# Patient Record
Sex: Male | Born: 1965 | Race: Black or African American | Hispanic: No | Marital: Married
Health system: Southern US, Community
[De-identification: ages and names within clinical notes are randomized; demographics above are authoritative.]

## PROBLEM LIST (undated history)

## (undated) DIAGNOSIS — I10 Essential (primary) hypertension: Secondary | ICD-10-CM

## (undated) DIAGNOSIS — I639 Cerebral infarction, unspecified: Secondary | ICD-10-CM

## (undated) DIAGNOSIS — I509 Heart failure, unspecified: Secondary | ICD-10-CM

---

## 2015-01-11 ENCOUNTER — Inpatient Hospital Stay: Payer: Self-pay | Admitting: Internal Medicine

## 2015-01-12 ENCOUNTER — Other Ambulatory Visit: Payer: Self-pay | Admitting: Physician Assistant

## 2015-01-12 DIAGNOSIS — R7989 Other specified abnormal findings of blood chemistry: Secondary | ICD-10-CM

## 2015-01-12 DIAGNOSIS — I313 Pericardial effusion (noninflammatory): Secondary | ICD-10-CM

## 2015-01-12 DIAGNOSIS — I4891 Unspecified atrial fibrillation: Secondary | ICD-10-CM

## 2015-01-12 DIAGNOSIS — N179 Acute kidney failure, unspecified: Secondary | ICD-10-CM

## 2015-01-12 DIAGNOSIS — I10 Essential (primary) hypertension: Secondary | ICD-10-CM

## 2015-03-19 NOTE — Discharge Summary (Signed)
PATIENT NAME:  Dennis Hoover, Dennis Hoover MR#:  [id number] DATE OF BIRTH:  [birth year]  DATE OF ADMISSION:  01/11/2015 DATE OF DISCHARGE:  01/14/2015  ADMITTING COMPLAINT: Shortness of breath.   DISCHARGE DIAGNOSES:  1. Atrial fibrillation with rapid ventricular rate, now controlled.  2. Elevated troponin due to demand ischemia.  3. Chronic systolic congestive heart failure with ejection fraction of less than 20%.  4. Bilateral pneumonia.  5. Acute renal insufficiency.  6. Hypertension  7. Morbid obesity.  8. Recent tobacco abuse.   CONSULTATIONS:  1. Julien Nordmannimothy Gollan, MD, cardiology.  2. Dr. Mady GemmaMohammed Arida, MD, cardiology.   PROCEDURES: A 2D echocardiogram 01/12/2015 showing left ventricular ejection fraction less than 20%. Severely decreased global left ventricular systolic function, akinesis of the anterior and anterior septal walls, moderate concentric left ventricular hypertrophy, mild to moderate increased left ventricular internal cavity size, severely dilated left atrium, mildly dilated right atrium, small pericardial effusion, moderate mitral valve regurgitation, mild aortic valve sclerosis without stenosis and mild tricuspid regurgitation.   Chest x-ray 01/11/2015 shows diffuse reticular nodular opacities, suggesting bronchopneumonia, bronchitis. Also possible hypersensitivity, pneumonitis, interstitial pneumonias versus sarcoid disease. Repeat imaging once the patient has been treated for acute process is recommended.   HISTORY OF PRESENT ILLNESS: This 49 year old African American man with past medical history of atrial fibrillation, hypertension, congestive heart failure and morbid obesity stopped taking his medications greater than 30 days ago. Over the past day of admission, he felt more short of breath and developed cough with yellow productive sputum. No fevers. On presentation to the Emergency Room, he is in atrial fibrillation with rapid ventricular rate. Has bilateral pneumonia, is wheezing  and elevated troponin.   HOSPITAL COURSE BY PROBLEM:  1. Atrial fibrillation with rapid ventricular rate: The patient was followed by cardiology throughout the hospitalization. He was treated with Lopressor 100 mg twice a day and Cardizem 120 mg, which was then increased to 180 mg. A 2D echocardiogram showed decreased ejection fraction. He does have a CHADS score of 1 and abnormal LV function, so was started on anticoagulation with Eliquis. He had been on Xarelto in the past, but had used up his coupons. The patient does not have any funding for medication and was given a coupon for Eliquis.  2. Bilateral pneumonia: The patient was treated with Levaquin and prednisone. At the time of discharge, he is breathing comfortably with good oxygen saturations on room air. Blood cultures did show gram-positive rods in one bottle, most likely a contaminant. He does have a leukocytosis, which is likely steroid effect. This will need to be monitored in the outpatient setting.  3. Elevated troponins: These are likely due to cardiac strain in the setting of severe chronic systolic congestive heart failure and atrial fibrillation with rapid ventricular rate. The patient reported having had a [facility], which was normal showing no obstructive disease. He did not have a catheterization here. He is discharged on Eliquis and lovastatin. He needs followup with his cardiologist within the next 2 weeks.  4. Hypertension: Blood pressure is very hard to control on multiple medications. He is being discharged with a well-controlled blood pressure on metoprolol, diltiazem, hydralazine and lisinopril.  5. Severe chronic systolic heart failure with ejection fraction less than 20%: The patient was followed by cardiology while inpatient here. [facility]. He needs to follow up with the cardiologist within the next 2 weeks. He  has  severely decreased left ventricular function and will need evaluation for automatic implantable cardiac defibrillator placement. He is now on Eliquis. He may also benefit from direct current cardioversion. He will also need to continue taking Lasix after discharge. He had no exacerbation during hospitalization.  6. Acute renal insufficiency. On presentation, the patient's creatinine was elevated at 1.5 with a decreased GFR of 51. On discharge, his creatinine is normal. Lasix is being resumed and lisinopril has been titrated upwards. His renal function needs to be monitored closely in the outpatient setting    DISCHARGE PHYSICAL EXAMINATION:  VITAL SIGNS: Temperature 97.5, pulse 69. Respirations 20, blood pressure 138/87, oxygenation 98% on room air.  GENERAL: No acute distress.  CARDIOVASCULAR: Irregular, rate controlled. He does have a 3/6 systolic ejection murmur. Trace bilateral edema. Peripheral pulses 1+.  RESPIRATORY: He has bibasilar crackles. Good air movement. No respiratory distress.  ABDOMEN: Soft, nontender, nondistended. No guarding, no rebound. Bowel sounds are normal.  PSYCHIATRIC: The patient alert and oriented with good insight into his clinical conditions.   LABORATORY DATA: Sodium 138, potassium 4.3, chloride 106, bicarbonate 22, BUN 18, creatinine 1.26, glucose 115. LFTs normal. White blood cell count 18, hemoglobin 14.1, platelets 364,000, MCV is 96.   DISCHARGE MEDICATIONS:  1. Mucinex 1200 mg 1 tablet 2 times a day as needed for cough.  2. Ranitidine 75 mg 1 tablet twice a day as needed for heartburn.  3. Lovastatin 40 mg 1 tablet once a day.  4. Aspirin 81 mg 1 tablet once a day.  5. Metoprolol 100 mg 1 tablet twice a day.  6. Diltiazem 180 mg 1 capsule once a day.  7. Hydralazine 100 mg 1 tablet every 6 hours.  8. Lisinopril 20 mg 1 tablet once a day.  9. Prednisone 20 mg 2 tablets once a day for 4 days and then stop.  10. Levofloxacin 750 mg 1 tablet every 24  hours for 4 days and then stop.  11. Eliquis 5 mg 1 tablet twice a day.  12. Lisinopril 20 mg 1 tablet once a day.   DISCHARGE INSTRUCTIONS:  DIET: Heart healthy, low sodium diet.  ACTIVITY: As tolerated.  FOLLOWUP: Please follow up with your primary care physician your cardiologist within 2 weeks.   TIME SPENT ON DISCHARGE: 45 minutes.   ____________________________ Ena Dawley. Clent Ridges, MD cpw:ap D: 01/14/2015 15:30:03 ET T: 01/14/2015 19:26:50 ET JOB#: [id number]  cc: Santina Evans P. Clent Ridges, MD, <Dictator> Leanna Sato, MD Gale Journey MD ELECTRONICALLY SIGNED 01/23/2015 10:43

## 2015-03-19 NOTE — Consult Note (Signed)
General Aspect Priamry Cardiologist: New to [facility] (patient reports previously followed by [facility] though no records are in [facility]) _____________________  49 year old male with history PAF (previously on Xarelto, he self discontinued this more than 30 days prior 2/2 TV commercials), HTN, obesity,and remote tobacco abuse who presented to [facility] on 2/24 with a 1-2 day history of increased dyspnea and was found to be in a-fib with RVR and also have possible bilateral pneumonitis with sepsis. _____________________  PMH: 1. PAF (previously on Xarelto, he self discontinued this more than 30 days prior 2/2 TV commercials) 2. HTN 3. Obesity 4. Remote tobacco abuse _____________________   Present Illness 49 year old male with the above problem list who presented to [facility] on 2/24 with a 1-2 day history of increased dyspnea and was found to be in a-fib with RVR and also have possible bilateral pneumonitis with sepsis. He reportedly was previously followed by [facility] for his PAF and was last evaluated there 7-8 months ago, though there are no notes in [facility]. He reports initially presenting to [facility] around 10/2013 with epigastric pain and SOB. He states he underwent cardiac cath at that time which was reportedly nonobstructive. He was also noted to have new onset a-fib with RVR at that time. He was started on rate control agents and Xarelto at that time per his report. He continued to take them and did well per his report until he began to see TV commercials stating Xarelto was a bad medication. It was at that time, approximately 30 days ago, he decided to stop all of his medications. He slowly began to develop increased dyspnea, lower extremity edema, abdominal distention, and early satiety at times. He denies any orthopnea or PND. No cough, chest pain, palpitations, nausea, vomiting, diaphoresis, presyncope, or syncope. His baseline weight is approximately 250-260 he reports. 1-2 days  prior he developed increased dyspnea prompting him to come into [facility] for evaluation. He denied any fever, chills, chest pain, cough, nausea, vomiting, diaphoresis, presyncope, or syncope. Upon his arrival to [facility] he was found to be in a-fib with RVR with HR in the 150s. He received Lopressor 100 mg, diltiazem 120 mg, and IV diltiazem 20 mg x 2 with improvement of HR to the 1-teens thus far. He was also found to have a troponin of 1.80 that has subsequently trended to 1.90-->1.70. CXR showed diffuse reticular nodular opacity suggests bronchopneumonia/bronchitis. He is currently asymptomatic.   Physical Exam:  GEN no acute distress, obese   HEENT hearing intact to voice   NECK supple  no JVD   RESP normal resp effort  wheezing  rhonchi   CARD Regular rate and rhythm  No murmur   ABD denies tenderness  soft   EXTR trace non pitting edema   SKIN normal to palpation   NEURO cranial nerves intact   PSYCH alert   Review of Systems:  General: Fatigue  Weakness   Skin: No Complaints   ENT: No Complaints   Eyes: No Complaints   Neck: No Complaints   Respiratory: Short of breath  Wheezing   Cardiovascular: Dyspnea  Edema   Gastrointestinal: No Complaints   Genitourinary: No Complaints   Vascular: No Complaints   Musculoskeletal: No Complaints   Neurologic: No Complaints   Hematologic: No Complaints   Endocrine: No Complaints   Psychiatric: No Complaints   Review of Systems: All other systems were reviewed and found to be negative   Family & Social  History:  Family and Social History:  Family History Hypertension   Social History positive ETOH, negative Illicit drugs   + Tobacco Prior (greater than 1 year)   Place of Living Home     HTN:    a fib:    rt TKR:   Home Medications: Medication Instructions Status  ranitidine 75 mg oral tablet 1 tab(s) orally 2 times a day, As Needed - for Indigestion, Heartburn Active  Mucinex 1200 mg oral tablet,  extended release 1 tab(s) orally 2 times a day, As Needed for congestion/cough.  Active  Xarelto 20 mg oral tablet 1 tab(s) orally once a day (in the evening) Active   Lab Results:  Hepatic:  24-Feb-16 19:33   Bilirubin, Total  1.3  Alkaline Phosphatase 75  SGPT (ALT) 23  SGOT (AST) 15  Total Protein, Serum 6.7  Albumin, Serum  3.2  Routine Micro:  24-Feb-16 21:17   Micro Text Report BLOOD CULTURE   COMMENT                   NO GROWTH IN 8-12 HOURS   ANTIBIOTIC                       Culture Comment NO GROWTH IN 8-12 HOURS  Result(s) reported on 12 Jan 2015 at 05:00AM.    22:45   Micro Text Report BLOOD CULTURE   COMMENT                   NO GROWTH IN 8-12 HOURS   ANTIBIOTIC                       Culture Comment NO GROWTH IN 8-12 HOURS  Result(s) reported on 12 Jan 2015 at 06:00AM.  Routine Chem:  24-Feb-16 19:33   Result Comment TROPONIN - RESULTS VERIFIED BY REPEAT TESTING.  - [facility] CALLED LEWIS FLORES @ 2051 01/11/15  - READ-BACK PROCESS PERFORMED.  Result(s) reported on 11 Jan 2015 at 08:20PM.  Glucose, Serum  106  BUN 18  Creatinine (comp)  1.56  Sodium, Serum 137  Potassium, Serum 4.2  Chloride, Serum 105  CO2, Serum 22  Calcium (Total), Serum 8.9  Osmolality (calc) 276  eGFR (African American) >60  eGFR (Non-African American)  51 (eGFR values <47m/min/1.73 m2 may be an indication of chronic kidney disease (CKD). Calculated eGFR, using the MRDR Study equation, is useful in  patients with stable renal function. The eGFR calculation will not be reliable in acutely ill patients when serum creatinine is changing rapidly. It is not useful in patients on dialysis. The eGFR calculation may not be applicable to patients at the low and high extremes of body sizes, pregnant women, and vegetarians.)  Anion Gap 10  25-Feb-16 04:51   Result Comment HPR - RESULTS VERIFIED BY REPEAT TESTING.  Result(s) reported on 12 Jan 2015 at 0[facility]  Cardiac:  24-Feb-16 19:33    Troponin I  1.80 (0.00-0.05 0.05 ng/mL or less: NEGATIVE  Repeat testing in 3-6 hrs  if clinically indicated. >0.05 ng/mL: POTENTIAL  MYOCARDIAL INJURY. Repeat  testing in 3-6 hrs if  clinically indicated. NOTE: An increase or decrease  of 30% or more on serial  testing suggests a  clinically important change)    23:45   Troponin I  1.90 (0.00-0.05 0.05 ng/mL or less: NEGATIVE  Repeat testing in 3-6 hrs  if clinically indicated. >0.05 ng/mL: POTENTIAL  MYOCARDIAL INJURY. Repeat  testing  in 3-6 hrs if  clinically indicated. NOTE: An increase or decrease  of 30% or more on serial  testing suggests a  clinically important change)  25-Feb-16 04:51   Troponin I  1.70 (0.00-0.05 0.05 ng/mL or less: NEGATIVE  Repeat testing in 3-6 hrs  if clinically indicated. >0.05 ng/mL: POTENTIAL  MYOCARDIAL INJURY. Repeat  testing in 3-6 hrs if  clinically indicated. NOTE: An increase or decrease  of 30% or more on serial  testing suggests a  clinically important change)  Routine Coag:  24-Feb-16 19:33   Activated PTT (APTT) 30.6 (A HCT value >55% may artifactually increase the APTT. In one study, the increase was an average of 19%. Reference: "Effect on Routine and Special Coagulation Testing Values of Citrate Anticoagulant Adjustment in Patients with High HCT Values." American Journal of Clinical Pathology 0762;263:335-456.)  Prothrombin  15.4 (11.4-15.0 NOTE: New Reference Range  12/16/14)  INR 1.2 (INR reference interval applies to patients on anticoagulant therapy. A single INR therapeutic range for coumarins is not optimal for all indications; however, the suggested range for most indications is 2.0 - 3.0. Exceptions to the INR Reference Range may include: Prosthetic heart valves, acute myocardial infarction, prevention of myocardial infarction, and combinations of aspirin and anticoagulant. The need for a higher or lower target INR must be assessed  individually. Reference: The Pharmacology and Management of the Vitamin K  antagonists: the seventh ACCP Conference on Antithrombotic and Thrombolytic Therapy. YBWLS.9373 Sept:126 (3suppl): [id number]. A HCT value >55% may artifactually increase the PT.  In one study,  the increase was an average of 25%. Reference:  "Effect on Routine and Special Coagulation Testing Values of Citrate Anticoagulant Adjustment in Patients with High HCT Values." American Journal of Clinical Pathology 2006;126:400-405.)  Routine Hem:  24-Feb-16 19:33   WBC (CBC)  14.7  RBC (CBC) 4.98  Hemoglobin (CBC) 15.7  Hematocrit (CBC) 48.0  Platelet Count (CBC) 367  MCV 96  MCH 31.4  MCHC 32.6  RDW 14.5  Neutrophil % 63.0  Lymphocyte % 25.9  Monocyte % 9.5  Eosinophil % 0.6  Basophil % 1.0  Neutrophil #  9.2  Lymphocyte #  3.8  Monocyte #  1.4  Eosinophil # 0.1  Basophil # 0.1 (Result(s) reported on 11 Jan 2015 at 08:20PM.)  25-Feb-16 04:51   WBC (CBC)  11.4  RBC (CBC) 4.96  Hemoglobin (CBC) 15.4  Hematocrit (CBC) 48.3  Platelet Count (CBC) 345  MCV 97  MCH 31.0  MCHC  31.8  RDW  15.0  Neutrophil % 89.7  Lymphocyte % 8.5  Monocyte % 1.3  Eosinophil % 0.2  Basophil % 0.3  Neutrophil #  10.2  Lymphocyte # 1.0  Monocyte #  0.1  Eosinophil # 0.0  Basophil # 0.0 (Result(s) reported on 12 Jan 2015 at 05:27AM.)   EKG:  EKG Interp. by me   Interpretation a-fib with RVR, 157 bpm, TWI I, II, aVF, V6   Radiology Results: XRay:    24-Feb-16 19:37, Chest PA and Lateral  Chest PA and Lateral   REASON FOR EXAM:    cough, wheezing, intermittent sob  COMMENTS:       PROCEDURE: DXR - DXR CHEST PA (OR AP) AND LATERAL  - Jan 11 2015  7:37PM     CLINICAL DATA:  49 year old male with a history of cough, wheezing,  shortness of breath.    EXAM:  CHEST - 2 VIEW    COMPARISON:  None.    FINDINGS:  Cardiomediastinal silhouette  within normal limits.  No evidence of pulmonary vascular  congestion.    Diffuse reticular nodular opacity through the lungs without  confluent airspace disease.    Blunting of the costophrenic sulcus.    No displaced fracture.    Unremarkable appearance of the upper abdomen.     IMPRESSION:  Diffuse reticular nodular opacity suggests  bronchopneumonia/bronchitis, given the history. Alternative  differential in the absence of known malignancy includes  hypersensitivity pneumonitis, interstitial pneumonias, and sarcoid.  Repeat imaging once the patient has been treated for this acute  process is recommended to assure resolution.    Signed,    Dulcy Fanny. Earleen Newport, DO    [facility]    [facility]      Electronically Signed    By: Corrie Mckusick D.O.    On: 01/11/2015 19:45         Verified By: Johny Shears, M.D.,    No Known Allergies:   Vital Signs/Nurse's Notes: **Vital Signs.:   25-Feb-16 08:06  Vital Signs Type Routine  Temperature Temperature (F) 97.6  Celsius 36.4  Temperature Source oral  Pulse Pulse 99  Respirations Respirations 18  Systolic BP Systolic BP 010  Diastolic BP (mmHg) Diastolic BP (mmHg) 932  Mean BP 141  Pulse Ox % Pulse Ox % 95  Pulse Ox Activity Level  At rest  Oxygen Delivery Room Air/ 21 %    Impression 49 year old male with history PAF (previously on Xarelto, he self discontinued this more than 30 days prior 2/2 TV commercials), HTN, obesity,and remote tobacco abuse [facility] a 1-2 day history of increased dyspnea and was found to be in a-fib with RVR and also have possible bilateral pneumonitis with sepsis.  1. A-fib with RVR: -He remains in a-fib with RVR this morning -Heart rates have improved from the 150s to 1-teens this morning with Lopressor 100 mg bid and Cardizem CD 120 mg -Increase Cardizem CD to 180 mg (BP poorly controlled) -Continue Lopressor 100 mg bid -Check echo -CHADSVASc at least 1 at this time (echo  pending), giving him an estimated annual risk of stroke at 1.3% -If echo shows normal LV function could likely hold Whang term anticoagulation given CHADSVASc of 1 and place patient on aspirin -Check Mg and TSH  2. Elevated troponin: -Likely 2/2 supply demand ischemia in the setting of his a-fib with RVR and bilateral PNA -Reported recent radial cath 10/2013 at [facility] that was nonobstructive, unfortunately no record in Care Everywhere -No ischemic evaluation planned at this time -Continue bb as above -Echo to evaluate LV function and wall motion  3. Bilateral PNA and sepsis: -On Levaquin -Nebs and steroids per IM  4. Acute renal insufficiency: -Unsure of chronicity  -Monitor  5. HTN: -Uncontrolled -Increased Cardizem as above -Continue Lopressor -Add hydralazine 10 mg tid   Electronic Signatures for Addendum Section:  Kathlyn Sacramento (MD) (Signed Addendum [phone] 14:14)  The patient was seen and examined. Agree with the above with the addition: He reports history of A-fib and possibly cardiomyopathy diagnosed at [facility] in 2014 with cardiac cath showing no obstructive disease. he stopped taking his medications more than a month ago and presented with worsening dyspnea and palpications. He was found to be in A-fib with RVR. STill mildly tachycardiac by exman. TnI is mildly elevated.  Echo showed an EF <20 %.  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 I requested his records from [facility].  Continue rate contorl. Mohrmann term anticoagulation is recommended. Can use Eliquis. IF unable to control rate, he might require TEE/DCCV.   Electronic Signatures: Kathlyn Sacramento (MD)  (Signed 25-Feb-16 14:14)  Co-Signer: General Aspect/Present Illness, History and Physical Exam, Review of System, Family & Social History, Past Medical History, Home Medications, Labs, EKG , Radiology, Allergies, Vital Signs/Nurse's Notes, Impression/Plan Seema Blum M (PA-C)  (Signed 25-Feb-16  11:08)  Authored: General Aspect/Present Illness, History and Physical Exam, Review of System, Family & Social History, Past Medical History, Home Medications, Labs, EKG , Radiology, Allergies, Vital Signs/Nurse's Notes, Impression/Plan   Last Updated: 25-Feb-16 14:14 by Kathlyn Sacramento (MD)

## 2015-03-19 NOTE — H&P (Signed)
PATIENT NAME:  Suzy BouchardLONG, Mazin M MR#:  [id number] DATE OF BIRTH:  08/28/1966  DATE OF ADMISSION:  01/11/2015  PRIMARY CARE PHYSICIAN:  Leanna SatoLinda M. Miles, MD  [facility]:   at [facility]   CHIEF COMPLAINT:  Shortness of breath.   HISTORY OF PRESENTING ILLNESS:  This is a 49 year old African-American male patient with a history of atrial fibrillation, hypertension, and morbid obesity, who stopped taking his medications 30 days back. Over the last day, he has felt more short of breath with cough with some yellow productive sputum. He did not have any fever. He presents to the Emergency Room. Here, he has been found to have atrial fibrillation with rapid ventricular rate along with bilateral pneumonitis, wheezing, and troponin of 1.8 and is being admitted to the hospitalist service.   He mentions that whenever he ambulates he gets an epigastric pain, which resolves with rest.   He does not complain of any orthopnea. He does have edema. He mentions that he was on furosemide in the past, which he stopped taking a month prior.   He mentions that he has been doing well. His heart rate was in the 80s, and he tried to lose weight, eat right, and stop the medications.   PAST MEDICAL HISTORY: 1.  Hypertension.  2.  Atrial fibrillation.  3.  Morbid obesity.  4.  Past tobacco abuse, quit one month back.  5.  Right total knee replacement.   FAMILY HISTORY:  Hypertension and MI in his dad at age of 50.   SOCIAL HISTORY:  The patient used to smoke cigars, but quit one month back. Occasional alcohol use; last drink was 2 weeks back. No illicit drug use. Works on [employer].   CODE STATUS:  Full code.   REVIEW OF SYSTEMS: CONSTITUTIONAL:  Complains of fatigue.  EYES:  No blurred vision, pain, or redness.  EARS, NOSE, AND THROAT:  No tinnitus, ear pain, or hearing loss.  RESPIRATORY:  Has cough and wheeze. CARDIOVASCULAR:  No chest pain or orthopnea. Has chronic edema.  GASTROINTESTINAL:  No nausea,  vomiting, diarrhea, or abdominal pain.  GENITOURINARY:  No dysuria, hematuria, or frequency.  ENDOCRINE:  No polyuria, nocturia, or thyroid problems.  HEMATOLOGIC AND LYMPHATIC:  No anemia or easy bruising or bleeding.  INTEGUMENTARY:  No acne, rash, or lesion.  MUSCULOSKELETAL:  No back pain or arthritis.  NEUROLOGIC:  No focal numbness, weakness, or seizure.  PSYCHIATRIC:  No anxiety or depression.   HOME MEDICATIONS:  The patient mentions that he was on Xarelto and Lasix in the past, but does not remember his other medications.   ALLERGIES:  No known drug allergies.   PHYSICAL EXAMINATION: VITAL SIGNS:  Temperature 98.4, pulse fluctuating between 95 to 120, , saturating 94% on room air.  GENERAL:  Obese, African-American male patient sitting up in bed with respiratory distress.  PSYCHIATRIC:  Alert and oriented x 3, pleasant.  HEENT:  Atraumatic, normocephalic. Oral mucosa is moist and pink. External ears and nose are normal. No pallor. No icterus. Pupils are bilaterally equal and reactive to light.  NECK:  Supple. No thyromegaly. No palpable lymph nodes. Trachea is midline. No bruits or JVD.  CARDIOVASCULAR:   tachycardic, irregular. Has 1+ edema.  RESPIRATORY:  Has bilateral wheezing and decreased air entry. No crackles.  GASTROINTESTINAL:  Soft abdomen, nontender. Bowel sounds present. No organomegaly palpable.  GENITOURINARY:  No CVA tenderness or bladder distention.  SKIN:  Warm and dry. No petechiae, rash, or ulcers.  MUSCULOSKELETAL:  No joint swelling, redness, or effusion of the large joints. Normal muscle tone.  NEUROLOGICAL:  Motor strength is 5/5 in upper extremities. Sensation to fine touch is intact all over. LYMPHATIC:  No cervical lymphadenopathy.   LABORATORY STUDIES:  Glucose is 106, BUN 18, creatinine 1.56, sodium 137, potassium 4.2, and GFR is 51. AST, ALT, and alkaline phosphatase are normal. Troponin is 1.8. WBC is 14.7, hemoglobin 15.7, platelets 367,000, and  neutrophils are 63%.   EKG shows atrial fibrillation with rapid ventricular rate.   Chest x-ray shows bronchopneumonia versus bronchitis.   ASSESSMENT AND PLAN: 1.  Bilateral pneumonitis with sepsis. The patient will be given IV fluids. We will start him on Levaquin. At this point, he does not need oxygen but also seems to be wheezing significantly. The patient will be given a stat dose of Solu-Medrol, and we will place him on 60 IV daily from tomorrow.  2.  Non-ST elevation myocardial infarction. The patient has an elevated troponin of 1.8. This could be secondary to demand ischemia, but the patient mentions that he has been getting epigastric pain on ambulation for a month, which resolves with rest. It could be angina symptoms. Presently, troponin is significantly elevated and needs to be further evaluated. We will start him on a heparin drip while we trend the troponin. We will start him on aspirin, statin, and beta blocker. We will get an echocardiogram and have cardiology evaluate the patient for need of cardiac catheterization.  3.  Atrial fibrillation with rapid ventricular rate. The patient does have a history of atrial fibrillation and has stopped taking his medications for a month now. We will give him a dose of Lopressor orally and use IV Cardizem p.r.n. for any rapid ventricular rate. The patient is presently on a heparin drip. He was on Xarelto in the past. This can be initiated after the heparin drip is stopped.  4.  Chronic kidney disease stage 3 versus acute renal failure. The patient's creatinine is at 1.56. No old creatinine is available at this point. This could be due to his sepsis and needs to be monitored.  5.  Deep vein thrombosis prophylaxis. The patient is on a heparin drip.   CODE STATUS:  Full code.   TIME SPENT ON THIS CASE:  40 minutes.    ____________________________ Molinda Bailiff Verlisa Vara, MD srs:nb D: 01/11/2015 21:37:23 ET T: 01/11/2015 22:01:24  ET JOB#: [id number]  cc: Wardell Heath R. Elpidio Anis, MD, <Dictator> Leanna Sato, MD Unknown cc   Orie Fisherman MD ELECTRONICALLY SIGNED 01/31/2015 8:28

## 2017-08-11 ENCOUNTER — Ambulatory Visit
Admission: RE | Admit: 2017-08-11 | Discharge: 2017-08-11 | Disposition: A | Discharge status: Home / Self Care | Payer: Disability Insurance | Source: Ambulatory Visit | Attending: Family Medicine | Admitting: Family Medicine

## 2017-08-11 ENCOUNTER — Ambulatory Visit
Admission: RE | Admit: 2017-08-11 | Discharge: 2017-08-11 | Disposition: A | Discharge status: Home / Self Care | Payer: Disability Insurance | Source: Ambulatory Visit | Attending: *Deleted | Admitting: *Deleted

## 2017-08-11 ENCOUNTER — Other Ambulatory Visit: Payer: Self-pay | Admitting: *Deleted

## 2017-08-11 DIAGNOSIS — M25562 Pain in left knee: Secondary | ICD-10-CM | POA: Diagnosis present

## 2017-08-11 DIAGNOSIS — M25561 Pain in right knee: Secondary | ICD-10-CM | POA: Diagnosis present

## 2017-08-11 DIAGNOSIS — M7651 Patellar tendinitis, right knee: Secondary | ICD-10-CM | POA: Diagnosis not present

## 2017-08-11 DIAGNOSIS — M25569 Pain in unspecified knee: Secondary | ICD-10-CM

## 2020-07-21 ENCOUNTER — Other Ambulatory Visit: Payer: Self-pay | Admitting: Dentistry

## 2020-07-21 ENCOUNTER — Ambulatory Visit
Admission: RE | Admit: 2020-07-21 | Discharge: 2020-07-21 | Disposition: A | Discharge status: Home / Self Care | Payer: Disability Insurance | Source: Ambulatory Visit | Attending: Dentistry | Admitting: Dentistry

## 2020-07-21 ENCOUNTER — Ambulatory Visit
Admission: RE | Admit: 2020-07-21 | Discharge: 2020-07-21 | Disposition: A | Discharge status: Home / Self Care | Payer: Disability Insurance | Attending: Dentistry | Admitting: Dentistry

## 2020-07-21 DIAGNOSIS — M25561 Pain in right knee: Secondary | ICD-10-CM | POA: Insufficient documentation

## 2020-07-21 DIAGNOSIS — M25562 Pain in left knee: Secondary | ICD-10-CM

## 2021-05-10 IMAGING — CR DG KNEE 1-2V*R*
2 series · 2 of 2 positions shown · non-contrast
Comparison: None.

CLINICAL DATA: Chronic bilateral knee pain.

EXAM:
RIGHT KNEE - 1-2 VIEW

[knee ap]
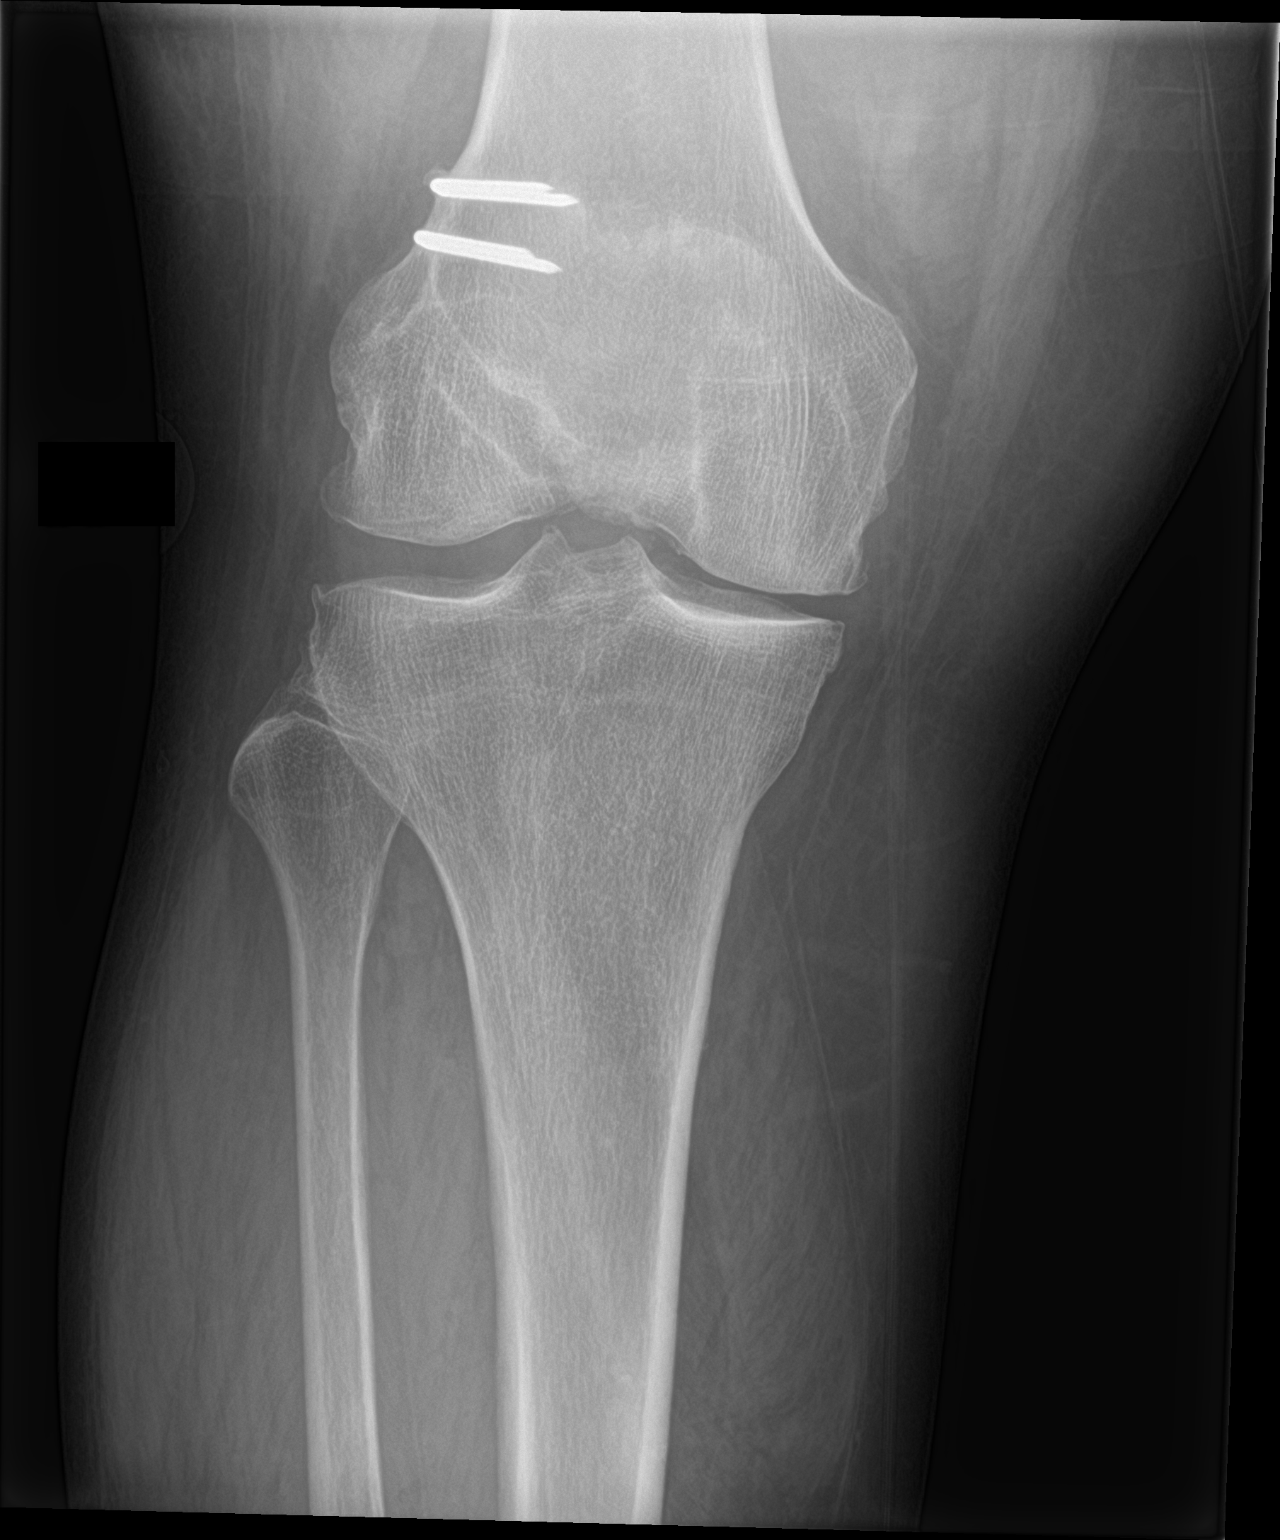

[knee lat]
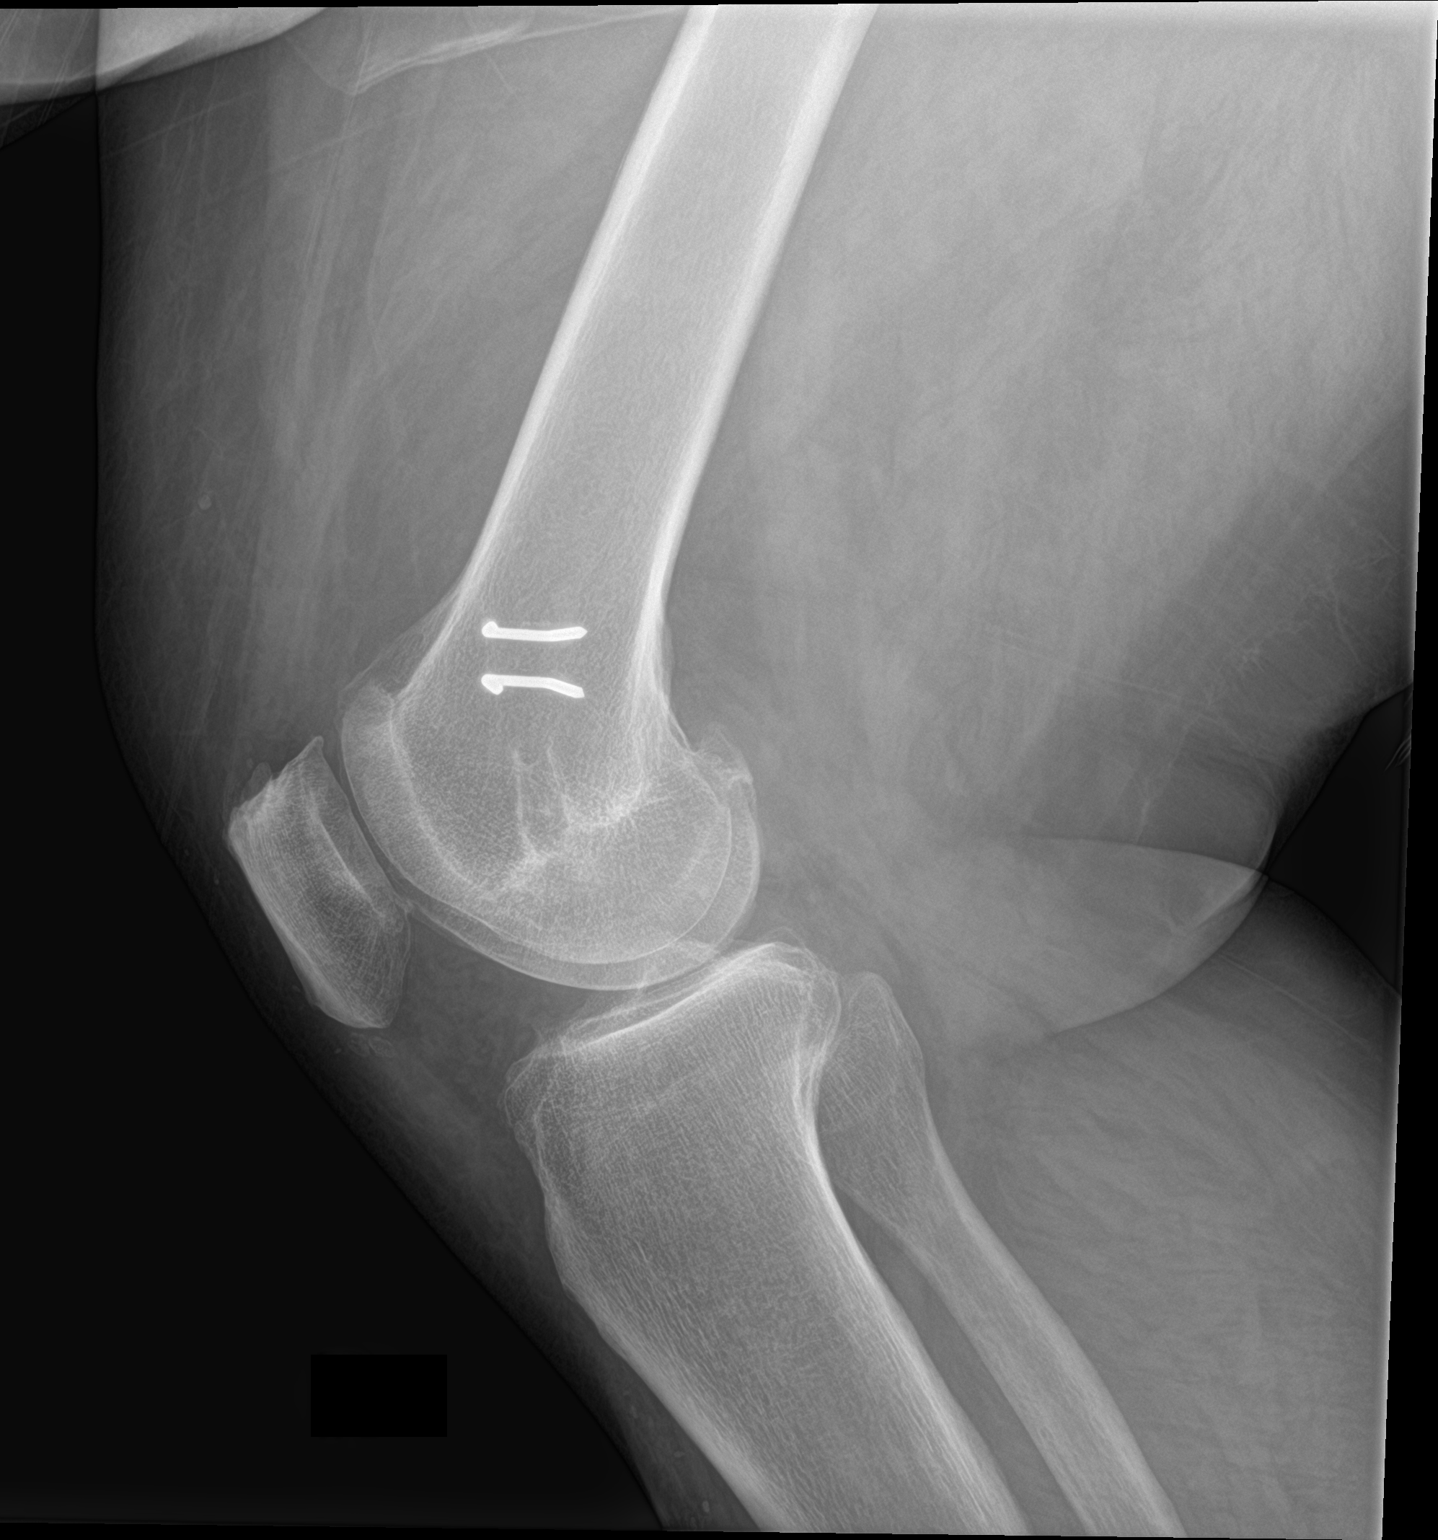

[2 of 2 positions shown; findings below may reference images not displayed]

FINDINGS: No evidence of fracture, dislocation, or joint effusion. Mild
narrowing of medial joint space is noted. Status post ACL
reconstruction. Soft tissues are unremarkable.
IMPRESSION: Mild degenerative joint disease. No acute abnormality seen in the
right knee.

## 2021-05-10 IMAGING — CR DG KNEE 1-2V*L*
2 series · 2 of 2 positions shown · non-contrast
Comparison: August 11, 2017.

CLINICAL DATA: Chronic bilateral knee pain.

EXAM:
LEFT KNEE - 1-2 VIEW

[knee ap]
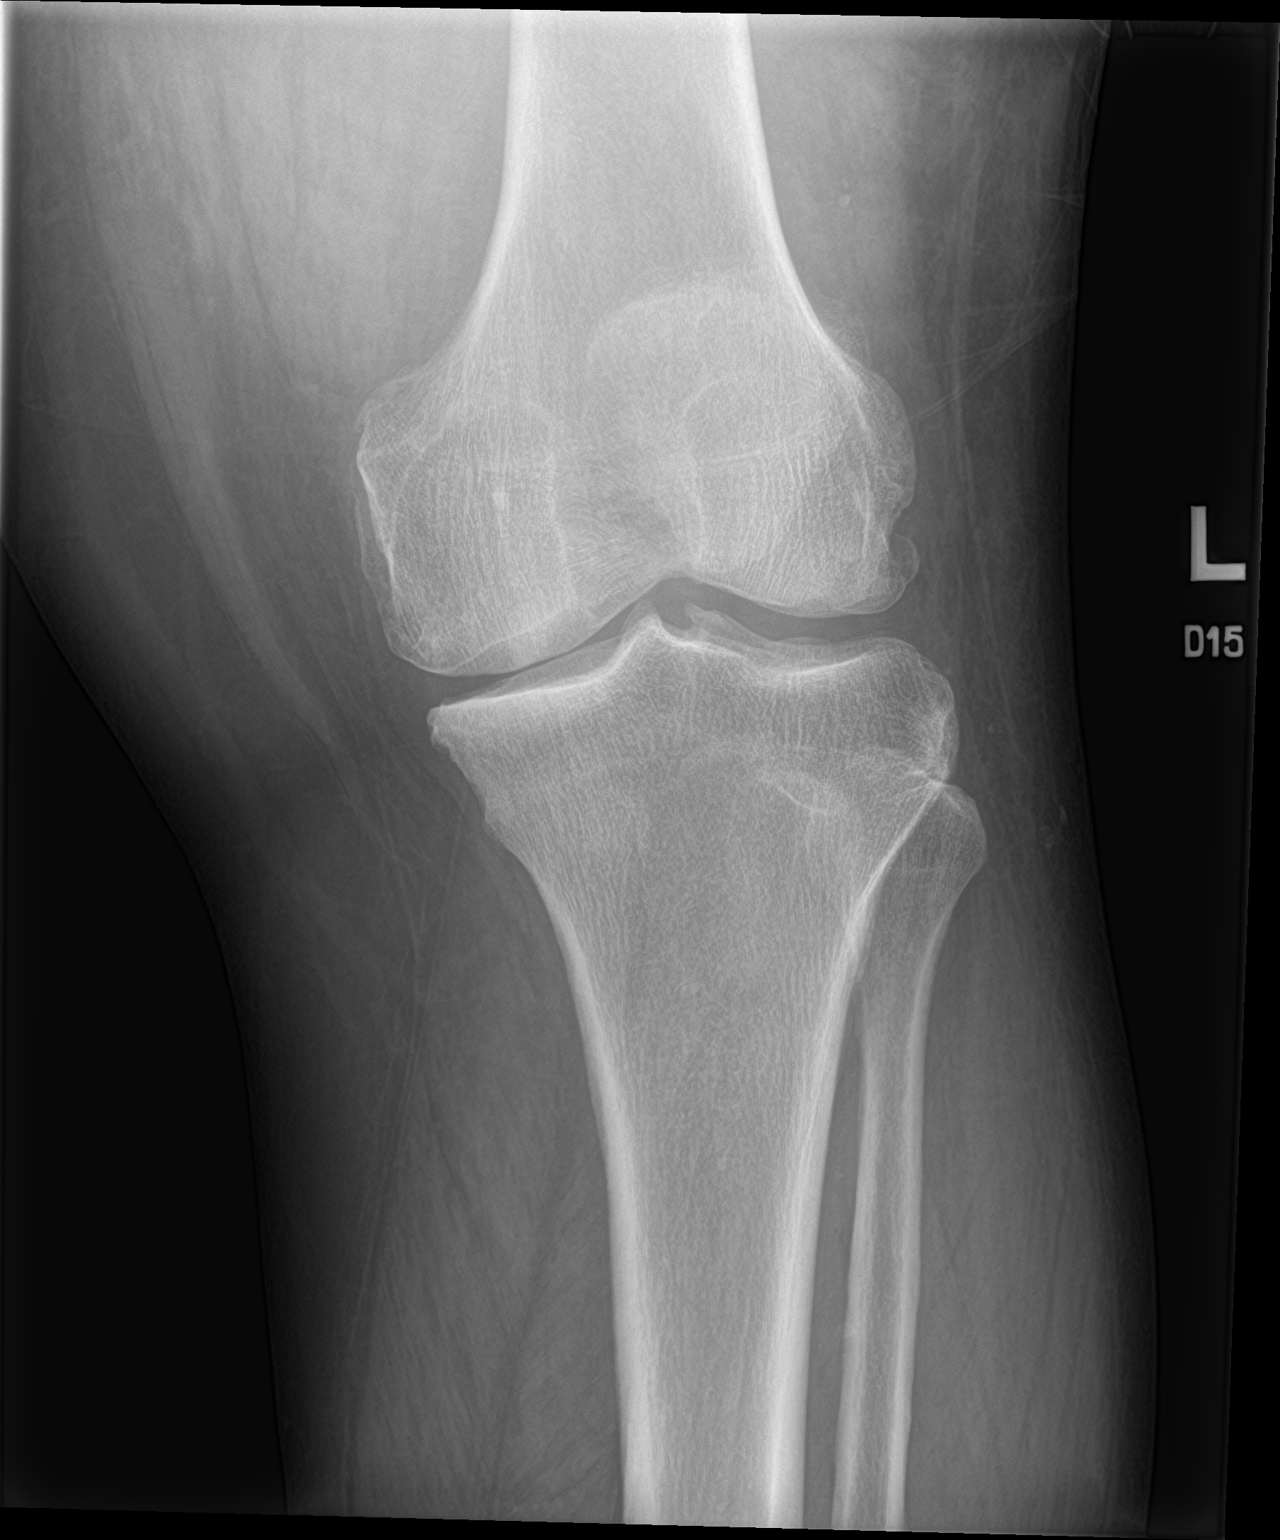

[knee lat]
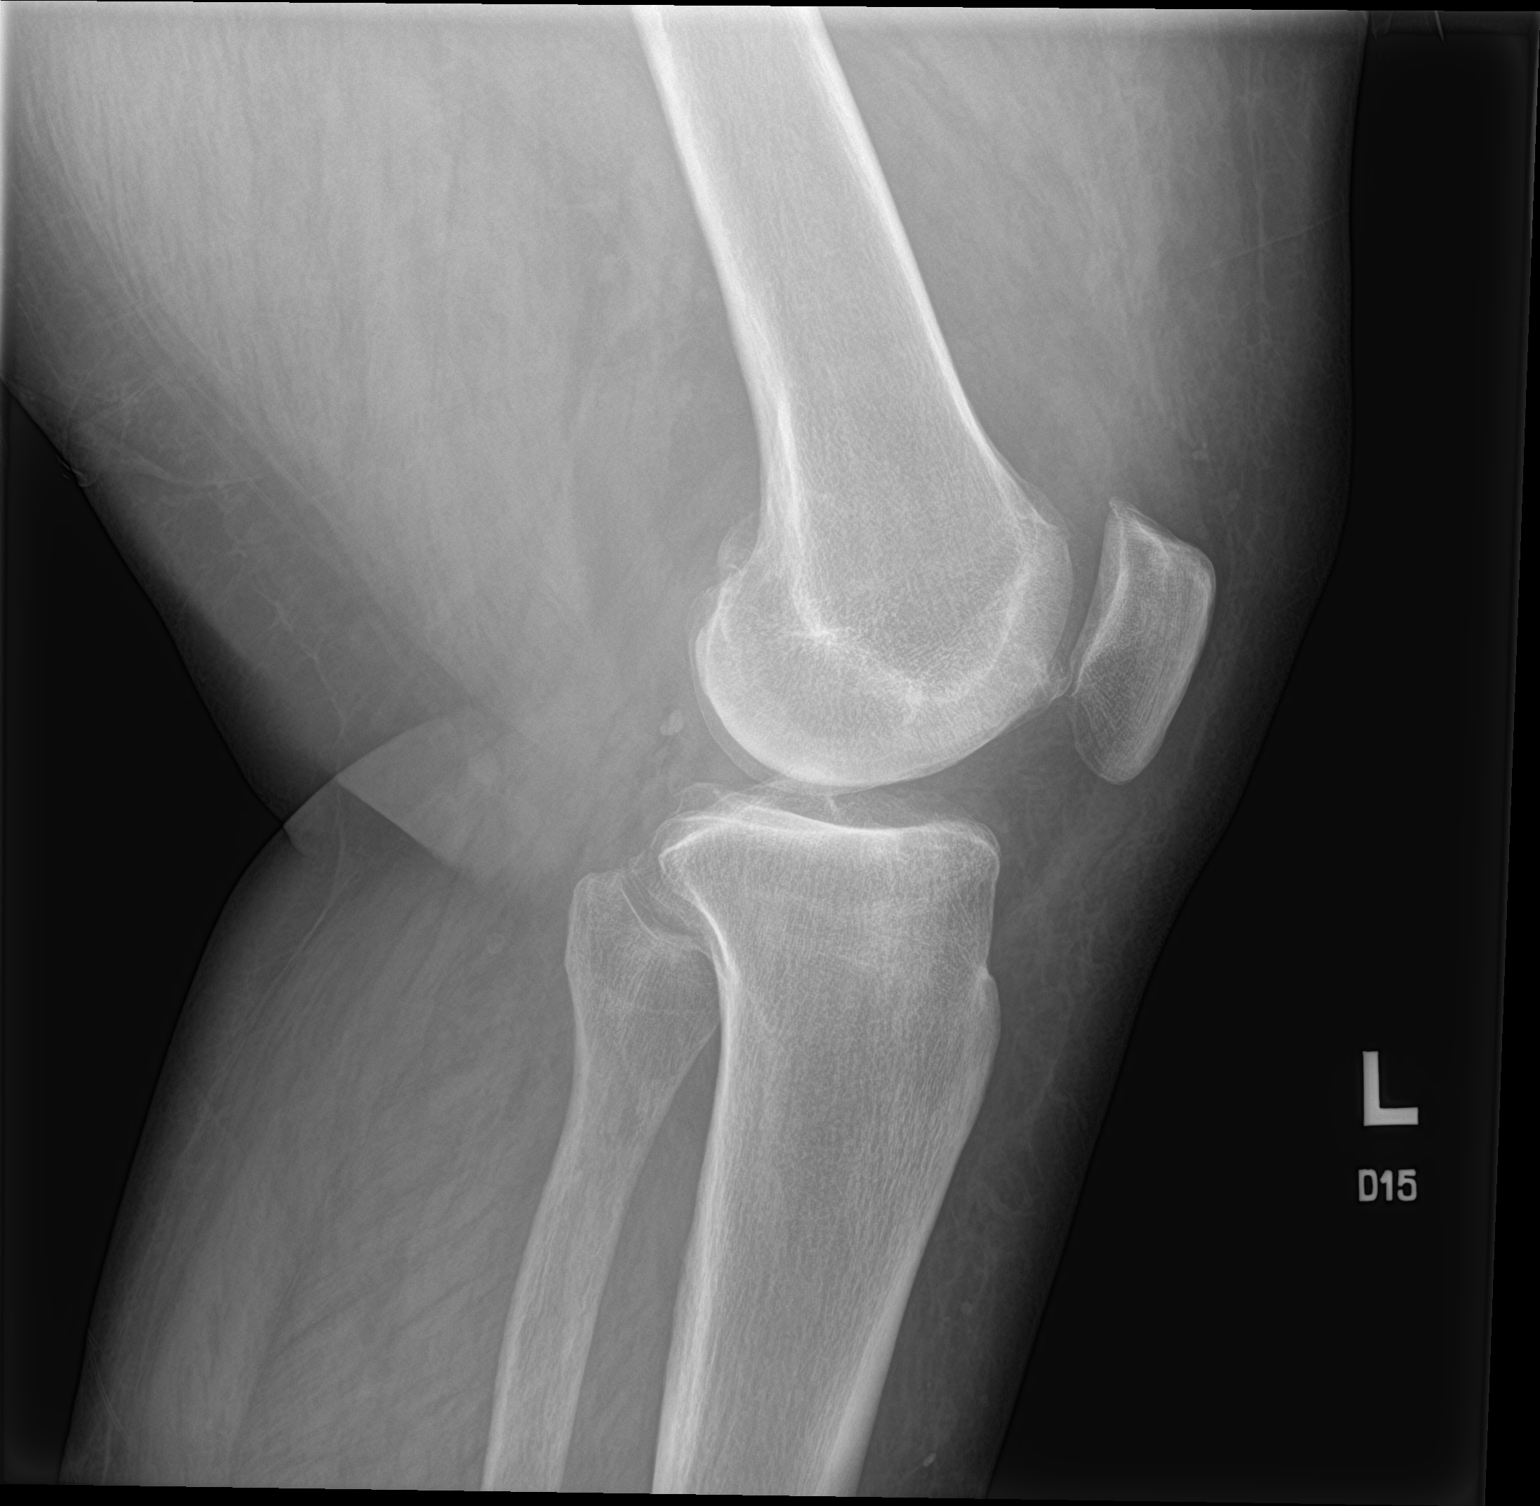

[2 of 2 positions shown; findings below may reference images not displayed]

FINDINGS: No evidence of fracture, dislocation, or joint effusion. Mild
narrowing of medial joint space is noted. Soft tissues are
unremarkable.
IMPRESSION: Mild degenerative joint disease is noted medially. No acute
abnormality seen in the left knee.

## 2022-02-18 ENCOUNTER — Emergency Department
Admission: EM | Admit: 2022-02-18 | Discharge: 2022-02-18 | Disposition: A | Discharge status: Home / Self Care | Payer: Medicaid Other | Attending: Emergency Medicine | Admitting: Emergency Medicine

## 2022-02-18 ENCOUNTER — Emergency Department: Payer: Medicaid Other

## 2022-02-18 ENCOUNTER — Other Ambulatory Visit: Payer: Self-pay

## 2022-02-18 DIAGNOSIS — I480 Paroxysmal atrial fibrillation: Secondary | ICD-10-CM | POA: Diagnosis not present

## 2022-02-18 DIAGNOSIS — I509 Heart failure, unspecified: Secondary | ICD-10-CM | POA: Insufficient documentation

## 2022-02-18 DIAGNOSIS — I472 Ventricular tachycardia, unspecified: Secondary | ICD-10-CM

## 2022-02-18 DIAGNOSIS — I110 Hypertensive heart disease with heart failure: Secondary | ICD-10-CM | POA: Insufficient documentation

## 2022-02-18 DIAGNOSIS — Z95810 Presence of automatic (implantable) cardiac defibrillator: Secondary | ICD-10-CM | POA: Diagnosis not present

## 2022-02-18 DIAGNOSIS — Z4502 Encounter for adjustment and management of automatic implantable cardiac defibrillator: Secondary | ICD-10-CM | POA: Diagnosis present

## 2022-02-18 DIAGNOSIS — I4729 Other ventricular tachycardia: Secondary | ICD-10-CM | POA: Insufficient documentation

## 2022-02-18 HISTORY — DX: Heart failure, unspecified: I50.9

## 2022-02-18 HISTORY — DX: Cerebral infarction, unspecified: I63.9

## 2022-02-18 HISTORY — DX: Essential (primary) hypertension: I10

## 2022-02-18 LAB — BASIC METABOLIC PANEL
Anion gap: 10 (ref 5–15)
BUN: 8 mg/dL (ref 6–20)
CO2: 24 mmol/L (ref 22–32)
Calcium: 8.9 mg/dL (ref 8.9–10.3)
Chloride: 101 mmol/L (ref 98–111)
Creatinine, Ser: 1.07 mg/dL (ref 0.61–1.24)
GFR, Estimated: >60 mL/min (ref >60)
Glucose, Bld: 133 mg/dL — ABNORMAL HIGH (ref 70–99)
Potassium: 3.7 mmol/L (ref 3.5–5.1)
Sodium: 135 mmol/L (ref 135–145)

## 2022-02-18 LAB — CBC
HCT: 44.7 % (ref 39.0–52.0)
Hemoglobin: 15 g/dL (ref 13.0–17.0)
MCH: 33.9 pg (ref 26.0–34.0)
MCHC: 33.6 g/dL (ref 30.0–36.0)
MCV: 101.1 fL — ABNORMAL HIGH (ref 80.0–100.0)
Platelets: 339 10*3/uL (ref 150–400)
RBC: 4.42 MIL/uL (ref 4.22–5.81)
RDW: 13.2 % (ref 11.5–15.5)
WBC: 16.6 10*3/uL — ABNORMAL HIGH (ref 4.0–10.5)
nRBC: 0 % (ref 0.0–0.2)

## 2022-02-18 LAB — TROPONIN I (HIGH SENSITIVITY): Troponin I (High Sensitivity): 14 ng/L (ref <18)

## 2022-02-18 NOTE — ED Notes (Signed)
Patient's defibrillator interrogated using [employer].  ?

## 2022-02-18 NOTE — ED Triage Notes (Signed)
Pt states he was at [facility] when defibrillator went off, was told to come here for EKG. Denies cp, shob.  ?Verbal orders Dr Larinda Buttery ?

## 2022-02-18 NOTE — ED Provider Notes (Signed)
? ?[facility] ?Provider Note ? ? Event Date/Time  ? First MD Initiated Contact with Patient 02/18/22 1654   ?  (approximate) ?History  ?No chief complaint on file. ? ?HPI ?Dennis Hoover is a 56 y.o. male with a stated past medical history of CHF, hypertension, and paroxysmal atrial fibrillation who presents from [facility] after patient states that his defibrillator went off.  Patient was told by the facility to come here for an EKG.  Patient currently denies any chest pain, shortness of breath, palpitations, or weakness/numbness/paresthesias in any extremity.  Patient states that before defibrillator went off he felt like he was having some reflux pain in the upper abdomen and then felt his defibrillator go off.  Patient denies any subsequent episodes of palpitations, abdominal pain, or shocks ?Physical Exam  ?Triage Vital Signs: ?ED Triage Vitals  ?Enc Vitals Group  ?   BP 02/18/22 1515 (!) 129/99  ?   Pulse Rate 02/18/22 1521 95  ?   Resp 02/18/22 1513 20  ?   Temp 02/18/22 1513 98 ?F (36.7 ?C)  ?   Temp src --   ?   SpO2 02/18/22 1515 96 %  ?   Weight 02/18/22 1513 (!) 312 lb (141.5 kg)  ?   Height 02/18/22 1513 5\' 2"  (1.575 m)  ?   Head Circumference --   ?   Peak Flow --   ?   Pain Score 02/18/22 1513 0  ?   Pain Loc --   ?   Pain Edu? --   ?   Excl. in [place]? --   ? ?Most recent vital signs: ?Vitals:  ? 02/18/22 1521 02/18/22 1807  ?BP:  132/84  ?Pulse: 95 84  ?Resp:  19  ?Temp:    ?SpO2:  98%  ? ?General: Awake, oriented x4. ?CV:  Good peripheral perfusion.  ?Resp:  Normal effort.  ?Abd:  No distention.  ?Other:  Overweight middle-aged African-American male laying in bed in no distress. ?ED Results / Procedures / Treatments  ?Labs ?(all labs ordered are listed, but only abnormal results are displayed) ?Labs Reviewed  ?BASIC METABOLIC PANEL - Abnormal; Notable for the following components:  ?    Result Value  ? Glucose, Bld 133 (*)   ? All other components within normal limits   ?CBC - Abnormal; Notable for the following components:  ? WBC 16.6 (*)   ? MCV 101.1 (*)   ? All other components within normal limits  ?TROPONIN I (HIGH SENSITIVITY)  ?TROPONIN I (HIGH SENSITIVITY)  ? ?EKG ?ED ECG REPORT ?I, Naaman Plummer, the attending physician, personally viewed and interpreted this ECG. ?Date: 02/18/2022 ?EKG Time: 1517 ?Rate: 95 ?Rhythm: normal sinus rhythm ?QRS Axis: normal ?Intervals: normal ?ST/T Wave abnormalities: normal ?Narrative Interpretation: no evidence of acute ischemia ?RADIOLOGY ?ED MD interpretation: 2 view chest x-ray interpreted by me shows no evidence of acute abnormalities including no pneumonia, pneumothorax, or widened mediastinum ?-Agree with radiology assessment ?Official radiology report(s): ?DG Chest 2 View ? ?Result Date: 02/18/2022 ?CLINICAL DATA:  Defibrillator discharged.  Denies chest complaints. EXAM: CHEST - 2 VIEW COMPARISON:  Chest x-ray dated January 11, 2015. FINDINGS: Left chest wall AICD is new since 2016. Unchanged mild cardiomegaly. Normal pulmonary vascularity. No focal consolidation, pleural effusion, or pneumothorax. No acute osseous abnormality. IMPRESSION: 1. No acute cardiopulmonary disease. Electronically Signed   By: Titus Dubin M.D.   On: 02/18/2022 15:58   ?PROCEDURES: ?Critical Care performed: No ?.1-3  Lead EKG Interpretation ?Performed by: Naaman Plummer, MD ?Authorized by: Naaman Plummer, MD  ? ?  Interpretation: normal   ?  ECG rate:  83 ?  ECG rate assessment: normal   ?  Rhythm: sinus rhythm   ?  Ectopy: none   ?  Conduction: normal   ?MEDICATIONS ORDERED IN ED: ?Medications - No data to display ?IMPRESSION / MDM / ASSESSMENT AND PLAN / ED COURSE  ?I reviewed the triage vital signs and the nursing notes. ?             ?               ?Differential diagnosis includes, but is not limited to, atrial fibrillation, ventricular tachycardia, ventricular fibrillation, ACS ?The patient is on the cardiac monitor to evaluate for evidence of  arrhythmia and/or significant heart rate changes. ?Patient a 56 year old male with the above-stated past medical history presents after the sensation that his defibrillator went off.  Patient currently denies any chest pain/shortness of breath or any weakness.  Patient's pacemaker was interrogated and indeed had an episode earlier today of ventricular tachycardia that he received a defibrillation for and has been in normal sinus rhythm ever since.  This report also shows intermittent atrial fibrillation that patient has paced by his pacemaker accordingly.  Patient has had no other signs/symptoms of defibrillation.  Patient has had no chest pain/shortness of breath during his emergency department course. ?The patient has been reexamined and is ready to be discharged.  All diagnostic results have been reviewed and discussed with the patient/family.  Care plan has been outlined and the patient/family understands all current diagnoses, results, and treatment plans.  There are no new complaints, changes, or physical findings at this time.  All questions have been addressed and answered.  Patient was instructed to, and agrees to follow-up with their primary care physician/cardiologist as well as return to the emergency department if any new or worsening symptoms develop. ? ?  ?FINAL CLINICAL IMPRESSION(S) / ED DIAGNOSES  ? ?Final diagnoses:  ?Presence of combination internal cardiac defibrillator (ICD) and pacemaker  ?Ventricular tachycardia ([place])  ?Paroxysmal atrial fibrillation ([facility])  ? ?Rx / DC Orders  ? ?ED Discharge Orders   ? ? None  ? ?  ? ?Note:  This document was prepared using Dragon voice recognition software and may include unintentional dictation errors. ?  ?Naaman Plummer, MD ?02/18/22 1830 ? ?
